# Patient Record
Sex: Female | Born: 1990 | Race: White | Hispanic: No | Marital: Single | Smoking: Current every day smoker
Health system: Southern US, Community
[De-identification: ages and names within clinical notes are randomized; demographics above are authoritative.]

## PROBLEM LIST (undated history)

## (undated) DIAGNOSIS — N83209 Unspecified ovarian cyst, unspecified side: Secondary | ICD-10-CM

## (undated) DIAGNOSIS — N289 Disorder of kidney and ureter, unspecified: Secondary | ICD-10-CM

## (undated) DIAGNOSIS — R748 Abnormal levels of other serum enzymes: Secondary | ICD-10-CM

## (undated) HISTORY — PX: HERNIA REPAIR: SHX51

---

## 2012-07-13 ENCOUNTER — Emergency Department (HOSPITAL_COMMUNITY): Payer: Medicaid - Out of State

## 2012-07-13 ENCOUNTER — Emergency Department (HOSPITAL_COMMUNITY)
Admission: EM | Admit: 2012-07-13 | Discharge: 2012-07-13 | Disposition: A | Discharge status: Home / Self Care | Payer: Medicaid - Out of State | Attending: Emergency Medicine | Admitting: Emergency Medicine

## 2012-07-13 ENCOUNTER — Encounter (HOSPITAL_COMMUNITY): Payer: Self-pay

## 2012-07-13 DIAGNOSIS — Z8742 Personal history of other diseases of the female genital tract: Secondary | ICD-10-CM | POA: Insufficient documentation

## 2012-07-13 DIAGNOSIS — Y9289 Other specified places as the place of occurrence of the external cause: Secondary | ICD-10-CM | POA: Insufficient documentation

## 2012-07-13 DIAGNOSIS — Z79899 Other long term (current) drug therapy: Secondary | ICD-10-CM | POA: Insufficient documentation

## 2012-07-13 DIAGNOSIS — W2209XA Striking against other stationary object, initial encounter: Secondary | ICD-10-CM | POA: Insufficient documentation

## 2012-07-13 DIAGNOSIS — Z8639 Personal history of other endocrine, nutritional and metabolic disease: Secondary | ICD-10-CM | POA: Insufficient documentation

## 2012-07-13 DIAGNOSIS — F17200 Nicotine dependence, unspecified, uncomplicated: Secondary | ICD-10-CM | POA: Insufficient documentation

## 2012-07-13 DIAGNOSIS — Z862 Personal history of diseases of the blood and blood-forming organs and certain disorders involving the immune mechanism: Secondary | ICD-10-CM | POA: Insufficient documentation

## 2012-07-13 DIAGNOSIS — S90129A Contusion of unspecified lesser toe(s) without damage to nail, initial encounter: Secondary | ICD-10-CM | POA: Insufficient documentation

## 2012-07-13 DIAGNOSIS — Z87448 Personal history of other diseases of urinary system: Secondary | ICD-10-CM | POA: Insufficient documentation

## 2012-07-13 DIAGNOSIS — S90122A Contusion of left lesser toe(s) without damage to nail, initial encounter: Secondary | ICD-10-CM

## 2012-07-13 DIAGNOSIS — Y9389 Activity, other specified: Secondary | ICD-10-CM | POA: Insufficient documentation

## 2012-07-13 HISTORY — DX: Unspecified ovarian cyst, unspecified side: N83.209

## 2012-07-13 HISTORY — DX: Abnormal levels of other serum enzymes: R74.8

## 2012-07-13 HISTORY — DX: Disorder of kidney and ureter, unspecified: N28.9

## 2012-07-13 LAB — POCT PREGNANCY, URINE: Preg Test, Ur: NEGATIVE

## 2012-07-13 MED ORDER — HYDROCODONE-ACETAMINOPHEN 5-325 MG PO TABS
1.0000 | ORAL_TABLET | Freq: Once | ORAL | Status: AC
  Administered 2012-07-13: 1 via ORAL
  Filled 2012-07-13: qty 1

## 2012-07-13 MED ORDER — ONDANSETRON 4 MG PO TBDP: 4.0000 mg | ORAL_TABLET | Freq: Once | ORAL | Status: AC

## 2012-07-13 MED ORDER — HYDROCODONE-ACETAMINOPHEN 5-325 MG PO TABS: 1.0000 | ORAL_TABLET | ORAL | Status: AC | PRN

## 2012-07-13 MED ORDER — ONDANSETRON 4 MG PO TBDP
ORAL_TABLET | ORAL | Status: AC
  Administered 2012-07-13: 4 mg via ORAL
  Filled 2012-07-13: qty 1

## 2012-07-13 NOTE — ED Provider Notes (Addendum)
History    This chart was scribed for Bethany Human, MD by Quintella Reichert, ED scribe.  This patient was seen in room APA05/[facility]05 and the patient's care was started at 11:31 AM.   CSN: [id number]  Arrival date & time 07/13/12  4098      Chief Complaint  Patient presents with  . Toe Pain     The history is provided by the patient. No language interpreter was used.    HPI Comments: Tikita Mabee is a 22 y.o. female who presents to the Emergency Department complaining of constant, moderate left 3rd toe pain that began yesterday when she accidentally kicked a metal pole.  She also notes visible swelling and bruising to the toe, and reports that pain is exacerbated by moving the toe and bearing weight.  She denies weakness, numbness or tingling to the area.  She denies injury to any other area, nausea, emesis, fever, chills, SOB or any other associated symptoms.   Past Medical History  Diagnosis Date  . Elevated liver enzymes   . 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 Ovarian cyst     Past Surgical History  Procedure Laterality Date  . Hernia repair      No family history on file.  History  Substance Use Topics  . Smoking status: Current Every Day Smoker  . Smokeless tobacco: Not on file  . Alcohol Use: No    OB History   Grav Para Term Preterm Abortions TAB SAB Ect Mult Living   1               Review of Systems  Musculoskeletal:       Left 3rd toe pain  All other systems reviewed and are negative.    Allergies  Aspirin and Codeine  Home Medications   Current Outpatient Rx  Name  Route  Sig  Dispense  Refill  . citalopram (CELEXA) 20 MG tablet   Oral   Take 20 mg by mouth daily.         . Promethazine HCl (PHENERGAN PO)   Oral   Take 2 tablets by mouth daily as needed (nausea).         . QUEtiapine (SEROQUEL) 100 MG tablet   Oral   Take 100 mg by mouth at bedtime.           BP 114/73  Pulse 97   Temp(Src) 98.1 F (36.7 C) (Oral)  Resp 18  Ht 5\' 1"  (1.549 m)  Wt 125 lb (56.7 kg)  BMI 23.63 kg/m2  SpO2 100%  LMP 07/12/2012  Breastfeeding? Unknown  Physical Exam  Nursing note and vitals reviewed. Constitutional: She is oriented to person, place, and time. She appears well-developed and well-nourished. No distress.  HENT:  Head: Normocephalic and atraumatic.  Eyes: Conjunctivae and EOM are normal.  Neck: Normal range of motion. Neck supple.  Pulmonary/Chest: Effort normal. No respiratory distress.  Musculoskeletal:  Swelling and ecchymosis over distal part of left 3rd toe  Neurological: She is alert and oriented to person, place, and time.  Skin: Skin is warm and dry.  Psychiatric: She has a normal mood and affect. Her behavior is normal.    ED Course  Procedures (including critical care time)  DIAGNOSTIC STUDIES: Oxygen Saturation is 100% on room air, normal by my interpretation.    COORDINATION OF CARE: 11:32 AM-Informed pt that imaging ruled out fracture.  Discussed treatment plan which  includes post-op shoe application and pain medication with pt at bedside and pt agreed to plan.    Results for orders placed during the hospital encounter of 07/13/12  POCT PREGNANCY, URINE      Result Value Range   Preg Test, Ur NEGATIVE  NEGATIVE    Dg Toe 3rd Left  07/13/2012   *RADIOLOGY REPORT*  Clinical Data: Left third toe pain following injury  LEFT THIRD TOE  Comparison: None.  Findings: No acute fracture or dislocation is noted.  No soft tissue abnormality is seen.  IMPRESSION: No acute abnormality noted.   Original Report Authenticated By: Alcide Clever, M.D.       1. Contusion of third toe, left, initial encounter     I personally performed the services described in this documentation, which was scribed in my presence. The recorded information has been reviewed and is accurate.  Bethany Human, MD     Carleene Cooper III, MD 07/13/12 1145  Carleene Cooper  III, MD 07/13/12 1146

## 2012-07-13 NOTE — ED Notes (Signed)
Pt reports yesterday accidentally kicked a metal pole.  Bruising noted to left 3rd toe.

## 2012-07-13 NOTE — ED Notes (Signed)
Pt reports LMP was April 20th.  Had positive home pregnancy test but says has been spotting for 2 days.

## 2012-08-08 ENCOUNTER — Emergency Department (HOSPITAL_COMMUNITY)
Admission: EM | Admit: 2012-08-08 | Discharge: 2012-08-08 | Disposition: A | Discharge status: Home / Self Care | Payer: Medicaid - Out of State | Attending: Emergency Medicine | Admitting: Emergency Medicine

## 2012-08-08 ENCOUNTER — Encounter (HOSPITAL_COMMUNITY): Payer: Self-pay

## 2012-08-08 DIAGNOSIS — N390 Urinary tract infection, site not specified: Secondary | ICD-10-CM | POA: Insufficient documentation

## 2012-08-08 DIAGNOSIS — Z87448 Personal history of other diseases of urinary system: Secondary | ICD-10-CM | POA: Insufficient documentation

## 2012-08-08 DIAGNOSIS — F17200 Nicotine dependence, unspecified, uncomplicated: Secondary | ICD-10-CM | POA: Insufficient documentation

## 2012-08-08 DIAGNOSIS — Z79899 Other long term (current) drug therapy: Secondary | ICD-10-CM | POA: Insufficient documentation

## 2012-08-08 DIAGNOSIS — Z3202 Encounter for pregnancy test, result negative: Secondary | ICD-10-CM | POA: Insufficient documentation

## 2012-08-08 DIAGNOSIS — Z8742 Personal history of other diseases of the female genital tract: Secondary | ICD-10-CM | POA: Insufficient documentation

## 2012-08-08 LAB — URINALYSIS, ROUTINE W REFLEX MICROSCOPIC
Bilirubin Urine: NEGATIVE
Hgb urine dipstick: NEGATIVE
Ketones, ur: NEGATIVE mg/dL
Nitrite: POSITIVE — AB
Urobilinogen, UA: 1 mg/dL (ref 0.0–1.0)

## 2012-08-08 LAB — URINE MICROSCOPIC-ADD ON

## 2012-08-08 MED ORDER — CEPHALEXIN 500 MG PO CAPS
500.0000 mg | ORAL_CAPSULE | Freq: Once | ORAL | Status: AC
  Administered 2012-08-08: 500 mg via ORAL
  Filled 2012-08-08: qty 1

## 2012-08-08 MED ORDER — CEPHALEXIN 500 MG PO CAPS: 500.0000 mg | ORAL_CAPSULE | Freq: Four times a day (QID) | ORAL | Status: AC

## 2012-08-08 MED ORDER — TRAMADOL HCL 50 MG PO TABS: 50.0000 mg | ORAL_TABLET | Freq: Four times a day (QID) | ORAL | Status: AC | PRN

## 2012-08-08 MED ORDER — HYDROCODONE-ACETAMINOPHEN 5-325 MG PO TABS
1.0000 | ORAL_TABLET | Freq: Once | ORAL | Status: AC
  Administered 2012-08-08: 1 via ORAL
  Filled 2012-08-08: qty 1

## 2012-08-08 NOTE — ED Notes (Signed)
Pt called desk stating  She was getting a migraine, and needed some pain medication NOW!  Explained to pt EDP would be in shortly to see her. Pt noted to be carrying on conversation with friend in room without distress noted.

## 2012-08-08 NOTE — ED Notes (Signed)
Pt called desk asking when dr would be in, and she was feeling dehydrated. Pt provided with a glass of ice water, to which she refused.

## 2012-08-08 NOTE — ED Notes (Signed)
Pt called desk again asking for pain medication for back pain, explained to pt that EDP was waiting for urine results to come back. Pt became angry stating she needed pain medication.

## 2012-08-08 NOTE — ED Notes (Signed)
Pt reports low back pain since Friday, injured her back picking up her son at that time. Denies any urinary s/s. H/o of chronic back pain. Does not see a doctor for her pain. Has taken 1 ibuprofen today

## 2012-08-08 NOTE — ED Provider Notes (Signed)
History    CSN: [id number] Arrival date & time 08/08/12  1535  First MD Initiated Contact with Patient 08/08/12 1547     Chief Complaint  Patient presents with  . Back Pain   (Consider location/radiation/quality/duration/timing/severity/associated sxs/prior Treatment) HPI Comments: Bethany Murphy is a 22 y.o. female who presents to the Emergency Department complaining of low back pain for 2 days.  States her back pain began suddenly after picking her up son.  Describes a sharp pain to her left lower back that is worse with weight bearing and certain movements.  States she has hx of chronic low back pain and pain is similar to previous.  Pain is not improved with OTC medications.  She denies dysuria, fever, abd pain, incontinence of bladder or bowel, perineal numbness, LE pain or numbness or vomiting.  Patient states that she does not have a PMD at this time but was recently treated for a UTI.  States that "my urine is always infected, but I don't have any symptoms like that right now".  The history is provided by the patient.   Past Medical History  Diagnosis Date  . Elevated liver enzymes   . 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 Ovarian cyst    Past Surgical History  Procedure Laterality Date  . Hernia repair     No family history on file. History  Substance Use Topics  . Smoking status: Current Every Day Smoker    Types: Cigarettes  . Smokeless tobacco: Not on file  . Alcohol Use: No   OB History   Grav Para Term Preterm Abortions TAB SAB Ect Mult Living   1              Review of Systems  Constitutional: Negative for fever.  Respiratory: Negative for shortness of breath.   Gastrointestinal: Negative for vomiting, abdominal pain and constipation.  Genitourinary: Negative for dysuria, frequency, hematuria, flank pain, decreased urine volume, difficulty urinating and pelvic pain.       No perineal numbness or incontinence of urine or  feces  Musculoskeletal: Positive for back pain. Negative for joint swelling.  Skin: Negative for rash.  Neurological: Negative for weakness and numbness.  All other systems reviewed and are negative.    Allergies  Aspirin and Codeine  Home Medications   Current Outpatient Rx  Name  Route  Sig  Dispense  Refill  . citalopram (CELEXA) 20 MG tablet   Oral   Take 20 mg by mouth daily.         Marland Kitchen HYDROcodone-acetaminophen (NORCO/VICODIN) 5-325 MG per tablet   Oral   Take 1 tablet by mouth every 4 (four) hours as needed for pain.   20 tablet   0   . Promethazine HCl (PHENERGAN PO)   Oral   Take 2 tablets by mouth daily as needed (nausea).         . QUEtiapine (SEROQUEL) 100 MG tablet   Oral   Take 100 mg by mouth at bedtime.          BP 100/65  Pulse 98  Temp(Src) 98.7 F (37.1 C) (Oral)  Resp 20  Ht 5\' 1"  (1.549 m)  Wt 130 lb (58.968 kg)  BMI 24.58 kg/m2  SpO2 100%  LMP 08/02/2012 Physical Exam  Nursing note and vitals reviewed. Constitutional: She is oriented to person, place, and time. She appears well-developed and well-nourished. No distress.  HENT:  Head:  Normocephalic and atraumatic.  Neck: Normal range of motion. Neck supple.  Cardiovascular: Normal rate, regular rhythm, normal heart sounds and intact distal pulses.   No murmur heard. Pulmonary/Chest: Effort normal and breath sounds normal. No respiratory distress.  Abdominal: Soft. She exhibits no distension. There is no hepatosplenomegaly. There is no tenderness. There is no rebound, no guarding and no CVA tenderness.  Musculoskeletal: She exhibits tenderness. She exhibits no edema.       Lumbar back: She exhibits tenderness and pain. She exhibits normal range of motion, no bony tenderness, no swelling, no deformity, no laceration and normal pulse.       Back:  ttp of the left lumbar paraspinal muscles.  No spinal tenderness.  DP pulses are brisk and symmetrical.  Distal sensation intact.  Hip  Flexors/Extensors are intact  Neurological: She is alert and oriented to person, place, and time. No cranial nerve deficit or sensory deficit. She exhibits normal muscle tone. Coordination and gait normal.  Reflex Scores:      Patellar reflexes are 2+ on the right side and 2+ on the left side.      Achilles reflexes are 2+ on the right side and 2+ on the left side. Skin: Skin is warm and dry.    ED Course  Procedures (including critical care time) Labs Reviewed  URINALYSIS, ROUTINE W REFLEX MICROSCOPIC - Abnormal; Notable for the following:    APPearance CLOUDY (*)    Nitrite POSITIVE (*)    Leukocytes, UA SMALL (*)    All other components within normal limits  URINE MICROSCOPIC-ADD ON - Abnormal; Notable for the following:    Squamous Epithelial / LPF FEW (*)    Bacteria, UA MANY (*)    All other components within normal limits  URINE CULTURE  PREGNANCY, URINE   Urine culture is pending   MDM    Patient has ttp of the left lower lumbar paraspinal muscles.  No focal neuro deficits on exam.  Ambulates with a steady gait.   Doubt emergent neurological process, no fever, abd pain, vomiting or CVA tenderness to suggest pyelonephritis.  Reports hx of chronic back pain but does not see pain management.    I have reviewed the patient on the [place] and [facility].  [facility] has a [facility] address with recent  [employer] filled in [place], but tells me that she lives locally.  Has asked the nurse several times for pain medication after my initial exam and now tells the nurse she feels dehydrated and getting a migraine.  No clinical signs of dehydration on my exam.  I suspect drug seeking behavior.  I will treat for a UTI with keflex and #10 ultram.    She is ambulatory and appears stable for discharge.  Agrees to f/u with her PMD.  Lerlene Treadwell L. Trisha Mangle, PA-C 08/09/12 2131

## 2012-08-10 LAB — URINE CULTURE

## 2012-08-11 NOTE — ED Notes (Signed)
+   Urine Patient treated with Cephalexin-STS chart appended per protocol.

## 2012-08-11 NOTE — ED Provider Notes (Signed)
Medical screening examination/treatment/procedure(s) were performed by non-physician practitioner and as supervising physician I was immediately available for consultation/collaboration.   Asaiah Scarber W Nikolette Reindl, MD 08/11/12 0721 

## 2013-10-15 ENCOUNTER — Emergency Department (HOSPITAL_COMMUNITY): Payer: Self-pay

## 2013-10-15 ENCOUNTER — Emergency Department (HOSPITAL_COMMUNITY)
Admission: EM | Admit: 2013-10-15 | Discharge: 2013-10-15 | Disposition: A | Discharge status: Home / Self Care | Payer: Self-pay | Attending: Emergency Medicine | Admitting: Emergency Medicine

## 2013-10-15 ENCOUNTER — Encounter (HOSPITAL_COMMUNITY): Payer: Self-pay | Admitting: Emergency Medicine

## 2013-10-15 DIAGNOSIS — S8990XA Unspecified injury of unspecified lower leg, initial encounter: Secondary | ICD-10-CM | POA: Insufficient documentation

## 2013-10-15 DIAGNOSIS — Z79899 Other long term (current) drug therapy: Secondary | ICD-10-CM | POA: Insufficient documentation

## 2013-10-15 DIAGNOSIS — S92919A Unspecified fracture of unspecified toe(s), initial encounter for closed fracture: Secondary | ICD-10-CM | POA: Insufficient documentation

## 2013-10-15 DIAGNOSIS — Z8742 Personal history of other diseases of the female genital tract: Secondary | ICD-10-CM | POA: Insufficient documentation

## 2013-10-15 DIAGNOSIS — S0990XA Unspecified injury of head, initial encounter: Secondary | ICD-10-CM | POA: Insufficient documentation

## 2013-10-15 DIAGNOSIS — S99919A Unspecified injury of unspecified ankle, initial encounter: Secondary | ICD-10-CM

## 2013-10-15 DIAGNOSIS — Z792 Long term (current) use of antibiotics: Secondary | ICD-10-CM | POA: Insufficient documentation

## 2013-10-15 DIAGNOSIS — F17200 Nicotine dependence, unspecified, uncomplicated: Secondary | ICD-10-CM | POA: Insufficient documentation

## 2013-10-15 DIAGNOSIS — S92911A Unspecified fracture of right toe(s), initial encounter for closed fracture: Secondary | ICD-10-CM

## 2013-10-15 DIAGNOSIS — S99929A Unspecified injury of unspecified foot, initial encounter: Secondary | ICD-10-CM

## 2013-10-15 DIAGNOSIS — S298XXA Other specified injuries of thorax, initial encounter: Secondary | ICD-10-CM | POA: Insufficient documentation

## 2013-10-15 MED ORDER — OXYCODONE-ACETAMINOPHEN 5-325 MG PO TABS: 1.0000 | ORAL_TABLET | ORAL | Status: AC | PRN

## 2013-10-15 MED ORDER — NAPROXEN 500 MG PO TABS: 500.0000 mg | ORAL_TABLET | Freq: Two times a day (BID) | ORAL | Status: AC

## 2013-10-15 MED ORDER — ONDANSETRON 4 MG PO TBDP
4.0000 mg | ORAL_TABLET | Freq: Once | ORAL | Status: AC
  Administered 2013-10-15: 4 mg via ORAL
  Filled 2013-10-15: qty 1

## 2013-10-15 MED ORDER — KETOROLAC TROMETHAMINE 60 MG/2ML IM SOLN
60.0000 mg | Freq: Once | INTRAMUSCULAR | Status: AC
  Administered 2013-10-15: 60 mg via INTRAMUSCULAR
  Filled 2013-10-15: qty 2

## 2013-10-15 NOTE — ED Notes (Signed)
MD at bedside. 
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
## 2013-10-15 NOTE — Discharge Instructions (Signed)
Call Dr. Hilda Lias for appointment this week for recheck of your toe - it is fractured and should be seen by an orthopedist this week.  Please call your doctor for a followup appointment within 24-48 hours. When you talk to your doctor please let them know that you were seen in the emergency department and have them acquire all of your records so that they can discuss the findings with you and formulate a treatment plan to fully care for your new and ongoing problems.
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
## 2013-12-12 ENCOUNTER — Encounter (HOSPITAL_COMMUNITY): Payer: Self-pay | Admitting: Emergency Medicine
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
